# Patient Record
Sex: Female | Born: 1970 | Race: Black or African American | Hispanic: No | Marital: Single | Smoking: Current every day smoker
Health system: Southern US, Community
[De-identification: ages and names within clinical notes are randomized; demographics above are authoritative.]

## PROBLEM LIST (undated history)

## (undated) DIAGNOSIS — G5600 Carpal tunnel syndrome, unspecified upper limb: Secondary | ICD-10-CM

## (undated) DIAGNOSIS — M51369 Other intervertebral disc degeneration, lumbar region without mention of lumbar back pain or lower extremity pain: Secondary | ICD-10-CM

## (undated) DIAGNOSIS — I10 Essential (primary) hypertension: Secondary | ICD-10-CM

## (undated) DIAGNOSIS — F319 Bipolar disorder, unspecified: Secondary | ICD-10-CM

## (undated) DIAGNOSIS — M5126 Other intervertebral disc displacement, lumbar region: Secondary | ICD-10-CM

## (undated) DIAGNOSIS — M5136 Other intervertebral disc degeneration, lumbar region: Secondary | ICD-10-CM

## (undated) DIAGNOSIS — M1990 Unspecified osteoarthritis, unspecified site: Secondary | ICD-10-CM

## (undated) DIAGNOSIS — E119 Type 2 diabetes mellitus without complications: Secondary | ICD-10-CM

---

## 2017-04-20 ENCOUNTER — Emergency Department
Admission: EM | Admit: 2017-04-20 | Discharge: 2017-04-20 | Disposition: A | Discharge status: Home / Self Care | Payer: Medicaid Other | Attending: Emergency Medicine | Admitting: Emergency Medicine

## 2017-04-20 ENCOUNTER — Encounter: Payer: Self-pay | Admitting: Medical Oncology

## 2017-04-20 DIAGNOSIS — Y929 Unspecified place or not applicable: Secondary | ICD-10-CM | POA: Insufficient documentation

## 2017-04-20 DIAGNOSIS — Y939 Activity, unspecified: Secondary | ICD-10-CM | POA: Insufficient documentation

## 2017-04-20 DIAGNOSIS — Y999 Unspecified external cause status: Secondary | ICD-10-CM | POA: Diagnosis not present

## 2017-04-20 DIAGNOSIS — S161XXA Strain of muscle, fascia and tendon at neck level, initial encounter: Secondary | ICD-10-CM | POA: Insufficient documentation

## 2017-04-20 DIAGNOSIS — E119 Type 2 diabetes mellitus without complications: Secondary | ICD-10-CM | POA: Insufficient documentation

## 2017-04-20 DIAGNOSIS — S199XXA Unspecified injury of neck, initial encounter: Secondary | ICD-10-CM | POA: Diagnosis present

## 2017-04-20 DIAGNOSIS — S161XXD Strain of muscle, fascia and tendon at neck level, subsequent encounter: Secondary | ICD-10-CM

## 2017-04-20 DIAGNOSIS — B9689 Other specified bacterial agents as the cause of diseases classified elsewhere: Secondary | ICD-10-CM | POA: Insufficient documentation

## 2017-04-20 DIAGNOSIS — I10 Essential (primary) hypertension: Secondary | ICD-10-CM | POA: Insufficient documentation

## 2017-04-20 DIAGNOSIS — N760 Acute vaginitis: Secondary | ICD-10-CM | POA: Diagnosis not present

## 2017-04-20 HISTORY — DX: Other intervertebral disc degeneration, lumbar region without mention of lumbar back pain or lower extremity pain: M51.369

## 2017-04-20 HISTORY — DX: Other intervertebral disc degeneration, lumbar region: M51.36

## 2017-04-20 HISTORY — DX: Bipolar disorder, unspecified: F31.9

## 2017-04-20 HISTORY — DX: Unspecified osteoarthritis, unspecified site: M19.90

## 2017-04-20 HISTORY — DX: Other intervertebral disc displacement, lumbar region: M51.26

## 2017-04-20 HISTORY — DX: Carpal tunnel syndrome, unspecified upper limb: G56.00

## 2017-04-20 HISTORY — DX: Essential (primary) hypertension: I10

## 2017-04-20 HISTORY — DX: Type 2 diabetes mellitus without complications: E11.9

## 2017-04-20 LAB — CHLAMYDIA/NGC RT PCR (ARMC ONLY)
Chlamydia Tr: NOT DETECTED
N gonorrhoeae: NOT DETECTED

## 2017-04-20 LAB — URINALYSIS, COMPLETE (UACMP) WITH MICROSCOPIC
Bilirubin Urine: NEGATIVE
Glucose, UA: NEGATIVE mg/dL
Ketones, ur: NEGATIVE mg/dL
Nitrite: NEGATIVE
PH: 7 (ref 5.0–8.0)
Protein, ur: NEGATIVE mg/dL
SPECIFIC GRAVITY, URINE: 1.009 (ref 1.005–1.030)

## 2017-04-20 LAB — WET PREP, GENITAL
SPERM: NONE SEEN
TRICH WET PREP: NONE SEEN
YEAST WET PREP: NONE SEEN

## 2017-04-20 MED ORDER — METHOCARBAMOL 750 MG PO TABS: 750.0000 mg | ORAL_TABLET | Freq: Four times a day (QID) | ORAL | 0 refills | Status: DC

## 2017-04-20 MED ORDER — IBUPROFEN 600 MG PO TABS: 600.0000 mg | ORAL_TABLET | Freq: Three times a day (TID) | ORAL | 0 refills | Status: DC | PRN

## 2017-04-20 MED ORDER — METRONIDAZOLE 500 MG PO TABS: 500.0000 mg | ORAL_TABLET | Freq: Two times a day (BID) | ORAL | 0 refills | Status: AC

## 2017-04-20 MED ORDER — TRAMADOL HCL 50 MG PO TABS: 50.0000 mg | ORAL_TABLET | Freq: Four times a day (QID) | ORAL | 0 refills | Status: DC | PRN
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
## 2017-04-20 NOTE — Discharge Instructions (Signed)
Advised to wean off of cervical support as directed. Contact orthopedic department to schedule appointment for evaluation and consideration for physical therapy.
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
## 2017-04-30 ENCOUNTER — Emergency Department
Admission: EM | Admit: 2017-04-30 | Discharge: 2017-05-01 | Disposition: A | Discharge status: Other Acute Inpatient Hospital | Payer: No Typology Code available for payment source | Attending: Emergency Medicine | Admitting: Emergency Medicine

## 2017-04-30 ENCOUNTER — Encounter: Payer: Self-pay | Admitting: Emergency Medicine

## 2017-04-30 DIAGNOSIS — I10 Essential (primary) hypertension: Secondary | ICD-10-CM | POA: Diagnosis not present

## 2017-04-30 DIAGNOSIS — R45851 Suicidal ideations: Secondary | ICD-10-CM | POA: Diagnosis not present

## 2017-04-30 DIAGNOSIS — F17210 Nicotine dependence, cigarettes, uncomplicated: Secondary | ICD-10-CM | POA: Diagnosis not present

## 2017-04-30 DIAGNOSIS — E119 Type 2 diabetes mellitus without complications: Secondary | ICD-10-CM | POA: Insufficient documentation

## 2017-04-30 DIAGNOSIS — F329 Major depressive disorder, single episode, unspecified: Secondary | ICD-10-CM | POA: Insufficient documentation

## 2017-04-30 LAB — COMPREHENSIVE METABOLIC PANEL WITH GFR
ALT: 17 U/L (ref 14–54)
AST: 22 U/L (ref 15–41)
Albumin: 4.7 g/dL (ref 3.5–5.0)
Alkaline Phosphatase: 85 U/L (ref 38–126)
Anion gap: 12 (ref 5–15)
BUN: 10 mg/dL (ref 6–20)
CO2: 19 mmol/L — ABNORMAL LOW (ref 22–32)
Calcium: 9.7 mg/dL (ref 8.9–10.3)
Chloride: 105 mmol/L (ref 101–111)
Creatinine, Ser: 0.89 mg/dL (ref 0.44–1.00)
GFR calc Af Amer: >60 mL/min
GFR calc non Af Amer: >60 mL/min
Glucose, Bld: 289 mg/dL — ABNORMAL HIGH (ref 65–99)
Potassium: 3.1 mmol/L — ABNORMAL LOW (ref 3.5–5.1)
Sodium: 136 mmol/L (ref 135–145)
Total Bilirubin: 0.5 mg/dL (ref 0.3–1.2)
Total Protein: 8.2 g/dL — ABNORMAL HIGH (ref 6.5–8.1)

## 2017-04-30 LAB — URINE DRUG SCREEN, QUALITATIVE (ARMC ONLY)
Amphetamines, Ur Screen: NOT DETECTED
Barbiturates, Ur Screen: NOT DETECTED
Benzodiazepine, Ur Scrn: POSITIVE — AB
Cannabinoid 50 Ng, Ur ~~LOC~~: POSITIVE — AB
Cocaine Metabolite,Ur ~~LOC~~: NOT DETECTED
MDMA (Ecstasy)Ur Screen: NOT DETECTED
Methadone Scn, Ur: NOT DETECTED
Opiate, Ur Screen: NOT DETECTED
Phencyclidine (PCP) Ur S: NOT DETECTED
Tricyclic, Ur Screen: POSITIVE — AB

## 2017-04-30 LAB — SALICYLATE LEVEL: Salicylate Lvl: <7 mg/dL (ref 2.8–30.0)

## 2017-04-30 LAB — LITHIUM LEVEL: Lithium Lvl: <0.06 mmol/L — ABNORMAL LOW (ref 0.60–1.20)

## 2017-04-30 LAB — PREGNANCY, URINE: Preg Test, Ur: NEGATIVE

## 2017-04-30 LAB — CBC
HEMATOCRIT: 42.8 % (ref 35.0–47.0)
HEMOGLOBIN: 14.2 g/dL (ref 12.0–16.0)
MCH: 29.8 pg (ref 26.0–34.0)
MCHC: 33.1 g/dL (ref 32.0–36.0)
MCV: 90 fL (ref 80.0–100.0)
Platelets: 268 10*3/uL (ref 150–440)
RBC: 4.76 MIL/uL (ref 3.80–5.20)
RDW: 13.3 % (ref 11.5–14.5)
WBC: 11.6 10*3/uL — ABNORMAL HIGH (ref 3.6–11.0)

## 2017-04-30 LAB — ACETAMINOPHEN LEVEL: Acetaminophen (Tylenol), Serum: <10 ug/mL — ABNORMAL LOW (ref 10–30)

## 2017-04-30 LAB — ETHANOL

## 2017-04-30 MED ORDER — INSULIN GLARGINE 100 UNIT/ML ~~LOC~~ SOLN
56.0000 [IU] | Freq: Every day | SUBCUTANEOUS | Status: DC
  Administered 2017-05-01: 56 [IU] via SUBCUTANEOUS
  Filled 2017-04-30 (×2): qty 0.56

## 2017-04-30 MED ORDER — POTASSIUM CHLORIDE CRYS ER 20 MEQ PO TBCR
40.0000 meq | EXTENDED_RELEASE_TABLET | Freq: Once | ORAL | Status: AC
  Administered 2017-05-01: 40 meq via ORAL
  Filled 2017-04-30: qty 2

## 2017-04-30 MED ORDER — LORAZEPAM 1 MG PO TABS
1.0000 mg | ORAL_TABLET | Freq: Once | ORAL | Status: AC
  Administered 2017-04-30: 1 mg via ORAL
  Filled 2017-04-30: qty 1
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
## 2017-04-30 NOTE — ED Notes (Signed)
Per Dr. Lamont Snowball, pt okay to not have sitter while in hallway and cooperative at this time.
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
## 2017-04-30 NOTE — ED Triage Notes (Signed)
First Nurse Note:  Arrives with [employer] - Voluntarily with c/o feeling suicidal.  Patient Calm and cooperative.
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
## 2017-05-01 ENCOUNTER — Encounter: Payer: Self-pay | Admitting: *Deleted

## 2017-05-01 ENCOUNTER — Inpatient Hospital Stay
Admission: AD | Admit: 2017-05-01 | Discharge: 2017-05-04 | DRG: 885 | Disposition: A | Discharge status: Home / Self Care | Payer: No Typology Code available for payment source | Attending: Psychiatry | Admitting: Psychiatry

## 2017-05-01 DIAGNOSIS — Z818 Family history of other mental and behavioral disorders: Secondary | ICD-10-CM

## 2017-05-01 DIAGNOSIS — F329 Major depressive disorder, single episode, unspecified: Secondary | ICD-10-CM | POA: Diagnosis not present

## 2017-05-01 DIAGNOSIS — I10 Essential (primary) hypertension: Secondary | ICD-10-CM | POA: Diagnosis present

## 2017-05-01 DIAGNOSIS — Z9119 Patient's noncompliance with other medical treatment and regimen: Secondary | ICD-10-CM | POA: Diagnosis not present

## 2017-05-01 DIAGNOSIS — R45851 Suicidal ideations: Secondary | ICD-10-CM | POA: Diagnosis present

## 2017-05-01 DIAGNOSIS — Z794 Long term (current) use of insulin: Secondary | ICD-10-CM | POA: Diagnosis not present

## 2017-05-01 DIAGNOSIS — F332 Major depressive disorder, recurrent severe without psychotic features: Principal | ICD-10-CM | POA: Diagnosis present

## 2017-05-01 DIAGNOSIS — F1310 Sedative, hypnotic or anxiolytic abuse, uncomplicated: Secondary | ICD-10-CM | POA: Diagnosis present

## 2017-05-01 DIAGNOSIS — F339 Major depressive disorder, recurrent, unspecified: Secondary | ICD-10-CM | POA: Diagnosis present

## 2017-05-01 DIAGNOSIS — F17210 Nicotine dependence, cigarettes, uncomplicated: Secondary | ICD-10-CM | POA: Diagnosis present

## 2017-05-01 DIAGNOSIS — G8929 Other chronic pain: Secondary | ICD-10-CM | POA: Diagnosis present

## 2017-05-01 DIAGNOSIS — E119 Type 2 diabetes mellitus without complications: Secondary | ICD-10-CM | POA: Diagnosis present

## 2017-05-01 DIAGNOSIS — K219 Gastro-esophageal reflux disease without esophagitis: Secondary | ICD-10-CM | POA: Diagnosis present

## 2017-05-01 DIAGNOSIS — F5105 Insomnia due to other mental disorder: Secondary | ICD-10-CM | POA: Diagnosis present

## 2017-05-01 DIAGNOSIS — F1210 Cannabis abuse, uncomplicated: Secondary | ICD-10-CM | POA: Diagnosis not present

## 2017-05-01 DIAGNOSIS — E785 Hyperlipidemia, unspecified: Secondary | ICD-10-CM | POA: Diagnosis present

## 2017-05-01 DIAGNOSIS — F4310 Post-traumatic stress disorder, unspecified: Secondary | ICD-10-CM | POA: Diagnosis present

## 2017-05-01 DIAGNOSIS — F17200 Nicotine dependence, unspecified, uncomplicated: Secondary | ICD-10-CM | POA: Diagnosis present

## 2017-05-01 DIAGNOSIS — N760 Acute vaginitis: Secondary | ICD-10-CM | POA: Diagnosis present

## 2017-05-01 LAB — GLUCOSE, CAPILLARY
GLUCOSE-CAPILLARY: 165 mg/dL — AB (ref 65–99)
GLUCOSE-CAPILLARY: 199 mg/dL — AB (ref 65–99)

## 2017-05-01 MED ORDER — FLUOXETINE HCL 20 MG PO CAPS
20.0000 mg | ORAL_CAPSULE | Freq: Every day | ORAL | Status: DC
  Administered 2017-05-02 – 2017-05-04 (×3): 20 mg via ORAL
  Filled 2017-05-01 (×3): qty 1

## 2017-05-01 MED ORDER — QUETIAPINE FUMARATE 200 MG PO TABS
300.0000 mg | ORAL_TABLET | Freq: Every day | ORAL | Status: DC
  Administered 2017-05-01 – 2017-05-03 (×3): 300 mg via ORAL
  Filled 2017-05-01 (×3): qty 1

## 2017-05-01 MED ORDER — TRAMADOL HCL 50 MG PO TABS
50.0000 mg | ORAL_TABLET | Freq: Once | ORAL | Status: AC
  Administered 2017-05-01: 50 mg via ORAL
  Filled 2017-05-01: qty 1

## 2017-05-01 MED ORDER — INSULIN GLARGINE 100 UNIT/ML ~~LOC~~ SOLN: 56.0000 [IU] | Freq: Every day | SUBCUTANEOUS | Status: DC

## 2017-05-01 MED ORDER — INSULIN ASPART 100 UNIT/ML ~~LOC~~ SOLN
0.0000 [IU] | Freq: Three times a day (TID) | SUBCUTANEOUS | Status: DC
  Administered 2017-05-02 (×3): 2 [IU] via SUBCUTANEOUS
  Administered 2017-05-03: 1 [IU] via SUBCUTANEOUS
  Filled 2017-05-01 (×3): qty 1

## 2017-05-01 MED ORDER — LABETALOL HCL 100 MG PO TABS
100.0000 mg | ORAL_TABLET | Freq: Every day | ORAL | Status: DC
  Administered 2017-05-02 – 2017-05-04 (×3): 100 mg via ORAL
  Filled 2017-05-01 (×4): qty 1

## 2017-05-01 MED ORDER — METFORMIN HCL 500 MG PO TABS
1000.0000 mg | ORAL_TABLET | Freq: Two times a day (BID) | ORAL | Status: DC
  Administered 2017-05-02 – 2017-05-04 (×5): 1000 mg via ORAL
  Filled 2017-05-01 (×5): qty 2

## 2017-05-01 MED ORDER — PANTOPRAZOLE SODIUM 40 MG PO TBEC
40.0000 mg | DELAYED_RELEASE_TABLET | Freq: Every day | ORAL | Status: DC
  Administered 2017-05-01 – 2017-05-04 (×4): 40 mg via ORAL
  Filled 2017-05-01 (×4): qty 1

## 2017-05-01 MED ORDER — TRAMADOL HCL 50 MG PO TABS
50.0000 mg | ORAL_TABLET | Freq: Four times a day (QID) | ORAL | Status: DC | PRN
Start: 2017-05-01 — End: 2017-05-04
  Administered 2017-05-01 – 2017-05-04 (×3): 50 mg via ORAL
  Filled 2017-05-01 (×4): qty 1

## 2017-05-01 MED ORDER — ATORVASTATIN CALCIUM 20 MG PO TABS
40.0000 mg | ORAL_TABLET | Freq: Every day | ORAL | Status: DC
  Administered 2017-05-01 – 2017-05-04 (×4): 40 mg via ORAL
  Filled 2017-05-01 (×4): qty 2

## 2017-05-01 MED ORDER — GABAPENTIN 400 MG PO CAPS
800.0000 mg | ORAL_CAPSULE | Freq: Three times a day (TID) | ORAL | Status: DC
  Administered 2017-05-01 – 2017-05-04 (×9): 800 mg via ORAL
  Filled 2017-05-01 (×9): qty 2

## 2017-05-01 MED ORDER — CLONAZEPAM 0.5 MG PO TABS
0.5000 mg | ORAL_TABLET | Freq: Two times a day (BID) | ORAL | Status: DC
  Administered 2017-05-01 – 2017-05-02 (×2): 0.5 mg via ORAL
  Filled 2017-05-01 (×2): qty 1

## 2017-05-01 MED ORDER — ALUM & MAG HYDROXIDE-SIMETH 200-200-20 MG/5ML PO SUSP: 30.0000 mL | ORAL | Status: DC | PRN

## 2017-05-01 MED ORDER — TRAMADOL HCL 50 MG PO TABS: 50.0000 mg | ORAL_TABLET | Freq: Once | ORAL | Status: DC

## 2017-05-01 MED ORDER — TRAMADOL HCL 50 MG PO TABS
50.0000 mg | ORAL_TABLET | Freq: Once | ORAL | Status: AC
  Administered 2017-05-01: 50 mg via ORAL
  Filled 2017-05-01 (×2): qty 1

## 2017-05-01 MED ORDER — MAGNESIUM HYDROXIDE 400 MG/5ML PO SUSP: 30.0000 mL | Freq: Every day | ORAL | Status: DC | PRN

## 2017-05-01 MED ORDER — ACETAMINOPHEN 500 MG PO TABS: 1000.0000 mg | ORAL_TABLET | Freq: Once | ORAL | Status: DC

## 2017-05-01 MED ORDER — INSULIN GLARGINE 100 UNIT/ML ~~LOC~~ SOLN
56.0000 [IU] | Freq: Every day | SUBCUTANEOUS | Status: DC
  Administered 2017-05-01 – 2017-05-03 (×3): 56 [IU] via SUBCUTANEOUS
  Filled 2017-05-01 (×4): qty 0.56

## 2017-05-01 MED ORDER — METHOCARBAMOL 750 MG PO TABS
750.0000 mg | ORAL_TABLET | Freq: Four times a day (QID) | ORAL | Status: DC
  Administered 2017-05-01 – 2017-05-04 (×12): 750 mg via ORAL
  Filled 2017-05-01 (×12): qty 1

## 2017-05-01 MED ORDER — ACETAMINOPHEN 325 MG PO TABS: 650.0000 mg | ORAL_TABLET | Freq: Four times a day (QID) | ORAL | Status: DC | PRN
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
## 2017-05-01 NOTE — ED Notes (Signed)
Patient asleep in room. No noted distress or abnormal behavior. Will continue 15 minute checks and observation by security cameras for safety. 
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
## 2017-05-01 NOTE — ED Notes (Addendum)
Pt complaining of neck pain 10/10.  EDP notified and ordered Ultram. Administered as ordered.  Pt laying in bed resting. Maintained on 15 minute checks and observation by security camera for safety.

## 2017-05-01 NOTE — ED Notes (Addendum)
Patient resting quietly in room. No noted distress or abnormal behaviors noted. Will continue 15 minute checks and observation by security camera for safety. 

## 2017-05-01 NOTE — Tx Team (Signed)
Initial Treatment Plan 05/01/2017 7:04 PM Richrd Humbles NFA:[id number]    PATIENT STRESSORS: Health problems Medication change or noncompliance Traumatic event   PATIENT STRENGTHS: Ability for insight Average or above average intelligence Capable of independent living Motivation for treatment/growth   PATIENT IDENTIFIED PROBLEMS: "get stable on my medications"  Feel better  Develop coping skills                 DISCHARGE CRITERIA:  Improved stabilization in mood, thinking, and/or behavior  PRELIMINARY DISCHARGE PLAN: Outpatient therapy  PATIENT/FAMILY INVOLVEMENT: This treatment plan has been presented to and reviewed with the patient, Nancy Chapman, and/or family member, .  The patient and family have been given the opportunity to ask questions and make suggestions.  Elige Radon, RN 05/01/2017, 7:04 PM

## 2017-05-01 NOTE — BH Assessment (Signed)
Patient is to be admitted to [facility] by Dr. Maryruth Bun.  Attending Physician will be Dr. Jennet Maduro.   Patient has been assigned to room 306A, by [facility] Charge Nurse Dedra Skeens Amy,  Patient's Nurse & Marylene Land Patient Access).

## 2017-05-01 NOTE — ED Provider Notes (Signed)
-----------------------------------------   7:04 AM on 05/01/2017 -----------------------------------------   Blood pressure 114/73, pulse 85, temperature 98.1 F (36.7 C), temperature source Oral, resp. rate 20, height 5' (1.524 m), weight 79.8 kg (176 lb), SpO2 100 %.  The patient had no acute events since last update.  Calm and cooperative at this time.  Disposition is pending Psychiatry/Behavioral Medicine team recommendations.     Willy Eddy, MD 05/01/17 [phone]

## 2017-05-01 NOTE — ED Notes (Signed)
Pt discharged to BMU.  Pt is IVC. VS stable. All belongings sent with patient. Report called to BMU.  Pt accepting of inpatient admission.

## 2017-05-02 ENCOUNTER — Encounter: Payer: Self-pay | Admitting: Psychiatry

## 2017-05-02 DIAGNOSIS — I10 Essential (primary) hypertension: Secondary | ICD-10-CM | POA: Diagnosis present

## 2017-05-02 DIAGNOSIS — F1210 Cannabis abuse, uncomplicated: Secondary | ICD-10-CM | POA: Diagnosis present

## 2017-05-02 DIAGNOSIS — F332 Major depressive disorder, recurrent severe without psychotic features: Principal | ICD-10-CM

## 2017-05-02 DIAGNOSIS — E119 Type 2 diabetes mellitus without complications: Secondary | ICD-10-CM

## 2017-05-02 DIAGNOSIS — F17200 Nicotine dependence, unspecified, uncomplicated: Secondary | ICD-10-CM | POA: Diagnosis present

## 2017-05-02 DIAGNOSIS — K219 Gastro-esophageal reflux disease without esophagitis: Secondary | ICD-10-CM | POA: Diagnosis present

## 2017-05-02 LAB — GLUCOSE, CAPILLARY
GLUCOSE-CAPILLARY: 156 mg/dL — AB (ref 65–99)
GLUCOSE-CAPILLARY: 160 mg/dL — AB (ref 65–99)
Glucose-Capillary: 168 mg/dL — ABNORMAL HIGH (ref 65–99)
Glucose-Capillary: 152 mg/dL — ABNORMAL HIGH (ref 65–99)

## 2017-05-02 LAB — LIPID PANEL
Cholesterol: 153 mg/dL (ref 0–200)
HDL: 25 mg/dL — ABNORMAL LOW (ref >40)
LDL CALC: 97 mg/dL (ref 0–99)
Total CHOL/HDL Ratio: 6.1 RATIO
Triglycerides: 157 mg/dL — ABNORMAL HIGH (ref <150)
VLDL: 31 mg/dL (ref 0–40)

## 2017-05-02 LAB — HEMOGLOBIN A1C
Hgb A1c MFr Bld: 8 % — ABNORMAL HIGH (ref 4.8–5.6)
Mean Plasma Glucose: 182.9 mg/dL

## 2017-05-02 MED ORDER — CLONAZEPAM 0.5 MG PO TABS
0.5000 mg | ORAL_TABLET | Freq: Two times a day (BID) | ORAL | Status: DC
  Administered 2017-05-02 – 2017-05-04 (×4): 0.5 mg via ORAL
  Filled 2017-05-02 (×4): qty 1

## 2017-05-02 MED ORDER — METRONIDAZOLE 0.75 % VA GEL
1.0000 | Freq: Every day | VAGINAL | Status: DC
  Administered 2017-05-02 – 2017-05-03 (×2): 1 via VAGINAL
  Filled 2017-05-02: qty 70

## 2017-05-02 MED ORDER — NICOTINE 7 MG/24HR TD PT24
7.0000 mg | MEDICATED_PATCH | Freq: Every day | TRANSDERMAL | Status: DC
  Filled 2017-05-02 (×2): qty 1

## 2017-05-02 NOTE — Plan of Care (Signed)
Problem: Safety: Goal: Periods of time without injury will increase Outcome: Progressing Pt safe on the unit at this time   
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
## 2017-05-02 NOTE — Plan of Care (Signed)
Problem: Safety: Goal: Ability to disclose and discuss suicidal ideas will improve Outcome: Progressing Patient denies any thoughts of harm to self and others  Problem: Spiritual Needs Goal: Ability to function at adequate level Outcome: Progressing Patient is up and about during the day and showing positive attitude about self
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
## 2017-05-02 NOTE — [facility] Suicide Risk Assessment (Signed)
[facility] INPATIENT:  Family/Significant Other Suicide Prevention Education  Suicide Prevention Education:  Patient Refusal for Family/Significant Other Suicide Prevention Education: The patient Nancy Chapman has refused to provide written consent for family/significant other to be provided Family/Significant Other Suicide Prevention Education during admission and/or prior to discharge.  Physician notified.  Verdene Lennert 05/02/2017, 11:13 AM

## 2017-05-02 NOTE — [facility] Group Notes (Signed)
[facility] LCSW Group Therapy 05/02/2017 1:15pm  Type of Therapy: Group Therapy- Feelings Around Discharge & Establishing a Supportive Framework  Participation Level:  None  Description of Group:   What is a supportive framework? What does it look like feel like and how do I discern it from and unhealthy non-supportive network? Learn how to cope when supports are not helpful and don't support you. Discuss what to do when your family/friends are not supportive.  Summary of Patient Progress Pt did not participate in group discussion but was attentive throughout.   Therapeutic Modalities:   Cognitive Behavioral Therapy Person-Centered Therapy Motivational Interviewing   Verdene Lennert, LCSW 05/02/2017 12:54 PM

## 2017-05-02 NOTE — Progress Notes (Signed)
Patient rated her depression and anxiety 10/10.Denies suicidal or homicidal ideations and AV hallucinations.In & out room.Minimal interactions with peers.Compliant with medications.Attended groups.Appetite and energy level good.Support and encouragement given.

## 2017-05-02 NOTE — Plan of Care (Signed)
Problem: Coping: Goal: Ability to verbalize feelings will improve Outcome: Progressing Patient is able to verbalize her feelings with staff.  Problem: Safety: Goal: Ability to disclose and discuss suicidal ideas will improve Outcome: Progressing Patient verbalized passive suicidal thoughts ,contracts for safety.  Problem: Self-Concept: Goal: Ability to verbalize positive feelings about self will improve Outcome: Progressing Patient verbalized that she could attend groups.
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
## 2017-05-02 NOTE — BHH Group Notes (Signed)
B[facility] Group Notes:  (Nursing/MHT/Case Management/Adjunct)  Date:  05/02/2017  Time:  4:53 AM  Type of Therapy:  Psychoeducational Skills  Participation Level:  Did Not Attend  Summary of Progress/Problems:  Nancy Chapman 05/02/2017, 4:53 AM

## 2017-05-02 NOTE — Progress Notes (Signed)
D: Pt denies SI/HI/AVH. Pt is pleasant and cooperative. Pt presents a little depressed, but brightens with conversation . Pt stated she was ready to go, planned to get back to her developmentally challenged son.   A: Pt was offered support and encouragement. Pt was given scheduled medications. Pt was encourage to attend groups. Q 15 minute checks were done for safety.   R:Pt attends groups and interacts well with peers and staff. Pt is taking medication. Pt has no complaints.Pt receptive to treatment and safety maintained on unit.

## 2017-05-03 LAB — GLUCOSE, CAPILLARY
GLUCOSE-CAPILLARY: 89 mg/dL (ref 65–99)
GLUCOSE-CAPILLARY: 104 mg/dL — AB (ref 65–99)
Glucose-Capillary: 120 mg/dL — ABNORMAL HIGH (ref 65–99)
Glucose-Capillary: 149 mg/dL — ABNORMAL HIGH (ref 65–99)

## 2017-05-03 MED ORDER — QUETIAPINE FUMARATE 300 MG PO TABS: 300.0000 mg | ORAL_TABLET | Freq: Every day | ORAL | 1 refills | Status: DC

## 2017-05-03 MED ORDER — FLUOXETINE HCL 20 MG PO CAPS: 20.0000 mg | ORAL_CAPSULE | Freq: Every day | ORAL | 1 refills | Status: DC

## 2017-05-03 MED ORDER — METFORMIN HCL 1000 MG PO TABS: 1000.0000 mg | ORAL_TABLET | Freq: Two times a day (BID) | ORAL | 1 refills | Status: DC

## 2017-05-03 MED ORDER — INSULIN GLARGINE 100 UNIT/ML ~~LOC~~ SOLN: 56.0000 [IU] | Freq: Every day | SUBCUTANEOUS | 11 refills | Status: AC

## 2017-05-03 MED ORDER — INSULIN GLARGINE 100 UNIT/ML ~~LOC~~ SOLN: 56.0000 [IU] | Freq: Every day | SUBCUTANEOUS | 11 refills | Status: DC

## 2017-05-03 MED ORDER — LITHIUM CARBONATE 300 MG PO CAPS: 300.0000 mg | ORAL_CAPSULE | Freq: Every day | ORAL | 1 refills | Status: AC

## 2017-05-03 MED ORDER — QUETIAPINE FUMARATE 300 MG PO TABS: 300.0000 mg | ORAL_TABLET | Freq: Every day | ORAL | 1 refills | Status: AC

## 2017-05-03 MED ORDER — LITHIUM CARBONATE 300 MG PO CAPS: 300.0000 mg | ORAL_CAPSULE | Freq: Every day | ORAL | 1 refills | Status: DC

## 2017-05-03 MED ORDER — GABAPENTIN 800 MG PO TABS: 800.0000 mg | ORAL_TABLET | Freq: Three times a day (TID) | ORAL | 1 refills | Status: AC

## 2017-05-03 MED ORDER — ATORVASTATIN CALCIUM 40 MG PO TABS: 40.0000 mg | ORAL_TABLET | Freq: Every day | ORAL | 1 refills | Status: AC

## 2017-05-03 MED ORDER — LABETALOL HCL 100 MG PO TABS: 100.0000 mg | ORAL_TABLET | Freq: Every day | ORAL | 1 refills | Status: AC

## 2017-05-03 MED ORDER — GABAPENTIN 800 MG PO TABS: 800.0000 mg | ORAL_TABLET | Freq: Three times a day (TID) | ORAL | 1 refills | Status: DC

## 2017-05-03 MED ORDER — LABETALOL HCL 100 MG PO TABS: 100.0000 mg | ORAL_TABLET | Freq: Every day | ORAL | 1 refills | Status: DC

## 2017-05-03 MED ORDER — OMEPRAZOLE 10 MG PO CPDR: 10.0000 mg | DELAYED_RELEASE_CAPSULE | Freq: Every day | ORAL | 1 refills | Status: AC

## 2017-05-03 MED ORDER — FLUOXETINE HCL 20 MG PO CAPS: 20.0000 mg | ORAL_CAPSULE | Freq: Every day | ORAL | 1 refills | Status: AC

## 2017-05-03 MED ORDER — LITHIUM CARBONATE ER 300 MG PO TBCR
300.0000 mg | EXTENDED_RELEASE_TABLET | Freq: Every day | ORAL | Status: DC
  Administered 2017-05-03: 300 mg via ORAL
  Filled 2017-05-03: qty 1

## 2017-05-03 MED ORDER — METFORMIN HCL 1000 MG PO TABS: 1000.0000 mg | ORAL_TABLET | Freq: Two times a day (BID) | ORAL | 1 refills | Status: AC

## 2017-05-03 MED ORDER — OMEPRAZOLE 10 MG PO CPDR: 10.0000 mg | DELAYED_RELEASE_CAPSULE | Freq: Every day | ORAL | 1 refills | Status: DC

## 2017-05-03 MED ORDER — ATORVASTATIN CALCIUM 40 MG PO TABS: 40.0000 mg | ORAL_TABLET | Freq: Every day | ORAL | 1 refills | Status: DC
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
## 2017-05-03 NOTE — Plan of Care (Signed)
Problem: Coping: Goal: Ability to verbalize feelings will improve Outcome: Progressing Patient able to converse with nurse about  feelings
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
## 2017-05-03 NOTE — Tx Team (Signed)
Interdisciplinary Treatment and Diagnostic Plan Update  05/03/2017 Time of Session: 10:30am Nancy Chapman MRN: [id number]  Principal Diagnosis: Severe episode of recurrent major depressive disorder, without psychotic features (HCC)  Secondary Diagnoses: Principal Problem:   Severe episode of recurrent major depressive disorder, without psychotic features (HCC) Active Problems:   Cannabis use disorder, mild, abuse   Diabetes (HCC)   HTN (hypertension)   GERD (gastroesophageal reflux disease)   Tobacco use disorder   Current Medications:  Current Facility-Administered Medications  Medication Dose Route Frequency Provider Last Rate Last Dose  . acetaminophen (TYLENOL) tablet 650 mg  650 mg Oral Q6H PRN Darliss Ridgel, MD      . alum & mag hydroxide-simeth (MAALOX/MYLANTA) 200-200-20 MG/5ML suspension 30 mL  30 mL Oral Q4H PRN Darliss Ridgel, MD      . atorvastatin (LIPITOR) tablet 40 mg  40 mg Oral Daily Darliss Ridgel, MD   40 mg at 05/03/17 0844  . clonazePAM (KLONOPIN) tablet 0.5 mg  0.5 mg Oral BID Darliss Ridgel, MD   0.5 mg at 05/03/17 0845  . FLUoxetine (PROZAC) capsule 20 mg  20 mg Oral Daily Darliss Ridgel, MD   20 mg at 05/03/17 0845  . gabapentin (NEURONTIN) capsule 800 mg  800 mg Oral TID Darliss Ridgel, MD   800 mg at 05/03/17 1341  . insulin aspart (novoLOG) injection 0-9 Units  0-9 Units Subcutaneous TID WC Darliss Ridgel, MD   1 Units at 05/03/17 1341  . insulin glargine (LANTUS) injection 56 Units  56 Units Subcutaneous QHS Darliss Ridgel, MD   56 Units at 05/02/17 2107  . labetalol (NORMODYNE) tablet 100 mg  100 mg Oral Daily Darliss Ridgel, MD   100 mg at 05/03/17 0845  . lithium carbonate (LITHOBID) CR tablet 300 mg  300 mg Oral QHS Pucilowska, Jolanta B, MD      . magnesium hydroxide (MILK OF MAGNESIA) suspension 30 mL  30 mL Oral Daily PRN Darliss Ridgel, MD      . metFORMIN (GLUCOPHAGE) tablet 1,000 mg  1,000 mg Oral BID WC Darliss Ridgel, MD   1,000 mg at 05/03/17  0844  . methocarbamol (ROBAXIN) tablet 750 mg  750 mg Oral QID Darliss Ridgel, MD   750 mg at 05/03/17 1340  . metroNIDAZOLE (METROGEL) 0.75 % vaginal gel 1 Applicatorful  1 Applicatorful Vaginal QHS Darliss Ridgel, MD   1 Applicatorful at 05/02/17 2107  . nicotine (NICODERM CQ - dosed in mg/24 hr) patch 7 mg  7 mg Transdermal Daily Darliss Ridgel, MD      . pantoprazole (PROTONIX) EC tablet 40 mg  40 mg Oral Daily Darliss Ridgel, MD   40 mg at 05/03/17 0845  . QUEtiapine (SEROQUEL) tablet 300 mg  300 mg Oral QHS Darliss Ridgel, MD   300 mg at 05/02/17 2106  . traMADol (ULTRAM) tablet 50 mg  50 mg Oral Q6H PRN Darliss Ridgel, MD   50 mg at 05/02/17 2106   PTA Medications: Prescriptions Prior to Admission  Medication Sig Dispense Refill Last Dose  . atorvastatin (LIPITOR) 40 MG tablet Take 40 mg by mouth daily.     Marland Kitchen FLUoxetine (PROZAC) 20 MG capsule Take 20 mg by mouth daily.     Marland Kitchen gabapentin (NEURONTIN) 800 MG tablet Take 800 mg by mouth 3 (three) times daily.     Marland Kitchen ibuprofen (ADVIL,MOTRIN) 600 MG tablet Take 1 tablet (600 mg  total) by mouth every 8 (eight) hours as needed. (Patient not taking: Reported on 04/30/2017) 15 tablet 0 Not Taking at Unknown time  . insulin glargine (LANTUS) 100 UNIT/ML injection Inject 56 Units into the skin at bedtime.     Marland Kitchen labetalol (NORMODYNE) 100 MG tablet Take 100 mg by mouth daily.     Marland Kitchen lithium carbonate 300 MG capsule Take 300 mg by mouth daily.     . methocarbamol (ROBAXIN-750) 750 MG tablet Take 1 tablet (750 mg total) by mouth 4 (four) times daily. (Patient not taking: Reported on 04/30/2017) 20 tablet 0 Not Taking at Unknown time  . omeprazole (PRILOSEC) 10 MG capsule Take 10 mg by mouth daily.     . QUEtiapine (SEROQUEL) 300 MG tablet Take 300 mg by mouth at bedtime.     . traMADol (ULTRAM) 50 MG tablet Take 1 tablet (50 mg total) by mouth every 6 (six) hours as needed. (Patient not taking: Reported on 04/30/2017) 20 tablet 0 Not Taking at Unknown time     Patient Stressors: Health problems Medication change or noncompliance Traumatic event  Patient Strengths: Ability for insight Average or above average intelligence Capable of independent living Motivation for treatment/growth  Treatment Modalities: Medication Management, Group therapy, Case management,  1 to 1 session with clinician, Psychoeducation, Recreational therapy.   Physician Treatment Plan for Primary Diagnosis: Severe episode of recurrent major depressive disorder, without psychotic features (HCC) Long Term Goal(s): Improvement in symptoms so as ready for discharge Improvement in symptoms so as ready for discharge   Short Term Goals: Ability to disclose and discuss suicidal ideas Ability to demonstrate self-control will improve Ability to identify and develop effective coping behaviors will improve Ability to maintain clinical measurements within normal limits will improve Ability to identify changes in lifestyle to reduce recurrence of condition will improve Ability to verbalize feelings will improve Ability to identify and develop effective coping behaviors will improve Ability to identify triggers associated with substance abuse/mental health issues will improve  Medication Management: Evaluate patient's response, side effects, and tolerance of medication regimen.  Therapeutic Interventions: 1 to 1 sessions, Unit Group sessions and Medication administration.  Evaluation of Outcomes: Progressing  Physician Treatment Plan for Secondary Diagnosis: Principal Problem:   Severe episode of recurrent major depressive disorder, without psychotic features (HCC) Active Problems:   Cannabis use disorder, mild, abuse   Diabetes (HCC)   HTN (hypertension)   GERD (gastroesophageal reflux disease)   Tobacco use disorder  Long Term Goal(s): Improvement in symptoms so as ready for discharge Improvement in symptoms so as ready for discharge   Short Term Goals: Ability to  disclose and discuss suicidal ideas Ability to demonstrate self-control will improve Ability to identify and develop effective coping behaviors will improve Ability to maintain clinical measurements within normal limits will improve Ability to identify changes in lifestyle to reduce recurrence of condition will improve Ability to verbalize feelings will improve Ability to identify and develop effective coping behaviors will improve Ability to identify triggers associated with substance abuse/mental health issues will improve     Medication Management: Evaluate patient's response, side effects, and tolerance of medication regimen.  Therapeutic Interventions: 1 to 1 sessions, Unit Group sessions and Medication administration.  Evaluation of Outcomes: Progressing   RN Treatment Plan for Primary Diagnosis: Severe episode of recurrent major depressive disorder, without psychotic features (HCC) Long Term Goal(s): Knowledge of disease and therapeutic regimen to maintain health will improve  Short Term Goals: Ability to verbalize feelings will  improve, Ability to identify and develop effective coping behaviors will improve and Compliance with prescribed medications will improve  Medication Management: RN will administer medications as ordered by provider, will assess and evaluate patient's response and provide education to patient for prescribed medication. RN will report any adverse and/or side effects to prescribing provider.  Therapeutic Interventions: 1 on 1 counseling sessions, Psychoeducation, Medication administration, Evaluate responses to treatment, Monitor vital signs and CBGs as ordered, Perform/monitor CIWA, COWS, AIMS and Fall Risk screenings as ordered, Perform wound care treatments as ordered.  Evaluation of Outcomes: Progressing   LCSW Treatment Plan for Primary Diagnosis: Severe episode of recurrent major depressive disorder, without psychotic features (HCC) Long Term Goal(s):  Safe transition to appropriate next level of care at discharge, Engage patient in therapeutic group addressing interpersonal concerns.  Short Term Goals: Engage patient in aftercare planning with referrals and resources, Increase social support, Increase ability to appropriately verbalize feelings, Identify triggers associated with mental health/substance abuse issues and Increase skills for wellness and recovery  Therapeutic Interventions: Assess for all discharge needs, 1 to 1 time with Social worker, Explore available resources and support systems, Assess for adequacy in community support network, Educate family and significant other(s) on suicide prevention, Complete Psychosocial Assessment, Interpersonal group therapy.  Evaluation of Outcomes: Progressing   Progress in Treatment: Attending groups: Yes. Participating in groups: Yes. Taking medication as prescribed: Yes. Toleration medication: Yes. Family/Significant other contact made: Yes, individual(s) contacted:    Patient understands diagnosis: Yes. Discussing patient identified problems/goals with staff: Yes. Medical problems stabilized or resolved: Yes. Denies suicidal/homicidal ideation: Yes. Issues/concerns per patient self-inventory: No. Other:    New problem(s) identified: No, Describe:     New Short Term/Long Term Goal(s):  Discharge Plan or Barriers:   Reason for Continuation of Hospitalization: Anxiety Depression Medication stabilization  Estimated Length of Stay:1 day   Attendees: Patient:Nancy Chapman [facility] 05/03/2017 4:23 PM  Physician: Kristine Linea 05/03/2017 4:23 PM  Nursing: Marjo Bicker 05/03/2017 4:23 PM  RN Care Manager: 05/03/2017 4:23 PM  Social Worker: Jake Shark, LCSW 05/03/2017 4:23 PM  Recreational Therapist:  05/03/2017 4:23 PM  Other:  05/03/2017 4:23 PM  Other:  05/03/2017 4:23 PM  Other: 05/03/2017 4:23 PM    Scribe for Treatment Team: Glennon Mac, LCSW 05/03/2017 4:23 PM

## 2017-05-03 NOTE — [facility] Group Notes (Signed)
[facility] Group Notes:  (Nursing/MHT/Case Management/Adjunct)  Date:  05/03/2017  Time:  3:32 PM  Type of Therapy:  Psychoeducational Skills  Participation Level:  Active  Participation Quality:  Appropriate, Attentive and Sharing  Affect:  Appropriate  Cognitive:  Alert, Appropriate and Oriented  Insight:  Appropriate and Good  Engagement in Group:  Engaged  Modes of Intervention:  Discussion, Education, Exploration and Socialization  Summary of Progress/Problems:  Nancy Chapman 05/03/2017, 3:32 PM

## 2017-05-04 LAB — GLUCOSE, CAPILLARY
Glucose-Capillary: 102 mg/dL — ABNORMAL HIGH (ref 65–99)
Glucose-Capillary: 145 mg/dL — ABNORMAL HIGH (ref 65–99)

## 2017-05-04 NOTE — Progress Notes (Addendum)
Receive Nancy Chapman this am in her room after breakfast. She was compliant with her medications. She rated her depression and anxiety a 3/4 on a scale of 10.  She denied feeling suicidal. She feels she is stable at this time and safe for discharge home. She received her discharge order and her discharge instructions reviewed and her questions were answered. Her personal belongings were returned and she was discharged to the shelter without incident to the bus stop via cursory vehicle .
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
## 2017-05-04 NOTE — Progress Notes (Signed)
  [facility] Adult Case Management Discharge Plan :  Will you be returning to the same living situation after discharge:  Yes,  [facility] At discharge, do you have transportation home?: No. Bus pass provided. Do you have the ability to pay for your medications: Yes,  medicaid  Release of information consent forms completed and in the chart;  Patient's signature needed at discharge.  Patient to Follow up at: Follow-up Information    [facility]. Go in 7 day(s).   Specialty:  Behavioral Health Why:  Please attend a walk in appointment for medication management between the hours of 9am-2pm, Monday-Friday.  Please bring a copy of your hospital discharge paperwork. Contact information: [address] [place] [address] [phone]          Next level of care provider has access to [facility] Link:no  Safety Planning and Suicide Prevention discussed: No.Pt refused consent.  Have you used any form of tobacco in the last 30 days? (Cigarettes, Smokeless Tobacco, Cigars, and/or Pipes): Yes  Has patient been referred to the Quitline?: Patient refused referral  Patient has been referred for addiction treatment: Yes  Lorri Frederick, LCSW 05/04/2017, 3:12 PM

## 2017-05-04 NOTE — [facility] Group Notes (Signed)
[facility] Group Notes:  (Nursing/MHT/Case Management/Adjunct)  Date:  05/04/2017  Time:  1:41 AM  Type of Therapy:  Psychoeducational Skills  Participation Level:  Active  Participation Quality:  Appropriate and Sharing  Affect:  Appropriate  Cognitive:  Alert and Appropriate  Insight:  Appropriate  Engagement in Group:  Engaged  Modes of Intervention:  Discussion, Socialization and Support  Summary of Progress/Problems:  Wilson Singer 05/04/2017, 1:41 AM
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
## 2017-05-04 NOTE — Plan of Care (Signed)
Problem: Activity: Goal: Sleeping patterns will improve Outcome: Progressing Patient slept for Estimated Hours of 7.30; Precautionary checks every 15 minutes for safety maintained, room free of safety hazards, patient sustains no injury or falls during this shift.    

## 2017-05-04 NOTE — [facility] Suicide Risk Assessment (Signed)
[facility] Discharge Suicide Risk Assessment   Principal Problem: Severe episode of recurrent major depressive disorder, without psychotic features [facility]) Discharge Diagnoses:  Patient Active Problem List   Diagnosis Date Noted  . Diabetes (HCC) [E11.9] 05/02/2017  . HTN (hypertension) [I10] 05/02/2017  . GERD (gastroesophageal reflux disease) [K21.9] 05/02/2017  . Tobacco use disorder [F17.200] 05/02/2017  . Cannabis use disorder, mild, abuse [F12.10]   . Severe episode of recurrent major depressive disorder, without psychotic features (HCC) [F33.2] 05/01/2017    Total Time spent with patient: 30 minutes  Musculoskeletal: Strength & Muscle Tone: within normal limits Gait & Station: normal Patient leans: N/A  Psychiatric Specialty Exam: Review of Systems  Musculoskeletal: Positive for back pain and myalgias.  Neurological: Negative.   Psychiatric/Behavioral: Positive for substance abuse.  All other systems reviewed and are negative.   Blood pressure 109/69, pulse 92, temperature 98.2 F (36.8 C), resp. rate 18, SpO2 100 %.There is no height or weight on file to calculate BMI.  General Appearance: Casual  Eye Contact::  Good  Speech:  Clear and Coherent409  Volume:  Normal  Mood:  Euthymic  Affect:  Appropriate  Thought Process:  Goal Directed and Descriptions of Associations: Intact  Orientation:  Full (Time, Place, and Person)  Thought Content:  WDL  Suicidal Thoughts:  No  Homicidal Thoughts:  No  Memory:  Immediate;   Fair Recent;   Fair Remote;   Fair  Judgement:  Impaired  Insight:  Shallow  Psychomotor Activity:  Normal  Concentration:  Fair  Recall:  [employer] of Knowledge:Fair  Language: Fair  Akathisia:  No  Handed:  Right  AIMS (if indicated):     Assets:  Communication Skills Desire for Improvement Financial Resources/Insurance Resilience Social Support  Sleep:  Number of Hours: 7.3  Cognition: WNL  ADL's:  Intact   Mental Status Per Nursing  Assessment::   On Admission:     Demographic Factors:  Divorced or widowed, Caucasian, Low socioeconomic status and Unemployed  Loss Factors: Loss of significant relationship and Financial problems/change in socioeconomic status  Historical Factors: Prior suicide attempts, Family history of mental illness or substance abuse and Impulsivity  Risk Reduction Factors:   Responsible for children under 36 years of age, Sense of responsibility to family and Positive social support  Continued Clinical Symptoms:  Depression:   Comorbid alcohol abuse/dependence Impulsivity Alcohol/Substance Abuse/Dependencies  Cognitive Features That Contribute To Risk:  None    Suicide Risk:  Minimal: No identifiable suicidal ideation.  Patients presenting with no risk factors but with morbid ruminations; may be classified as minimal risk based on the severity of the depressive symptoms    Plan Of Care/Follow-up recommendations:  Activity:  as tolerated. Diet:  low sodium heart healthy ADA diet. Other:  keep follow up appointments.  Kristine Linea, MD 05/04/2017, 8:07 AM

## 2017-05-06 ENCOUNTER — Emergency Department
Admission: EM | Admit: 2017-05-06 | Discharge: 2017-05-07 | Disposition: A | Discharge status: Home / Self Care | Payer: Medicaid Other | Attending: Student in an Organized Health Care Education/Training Program | Admitting: Student in an Organized Health Care Education/Training Program

## 2017-05-06 ENCOUNTER — Encounter: Payer: Self-pay | Admitting: Emergency Medicine

## 2017-05-06 ENCOUNTER — Emergency Department: Payer: Medicaid Other

## 2017-05-06 DIAGNOSIS — S161XXA Strain of muscle, fascia and tendon at neck level, initial encounter: Secondary | ICD-10-CM | POA: Diagnosis not present

## 2017-05-06 DIAGNOSIS — M545 Low back pain, unspecified: Secondary | ICD-10-CM

## 2017-05-06 DIAGNOSIS — E119 Type 2 diabetes mellitus without complications: Secondary | ICD-10-CM | POA: Diagnosis not present

## 2017-05-06 DIAGNOSIS — X58XXXA Exposure to other specified factors, initial encounter: Secondary | ICD-10-CM | POA: Diagnosis not present

## 2017-05-06 DIAGNOSIS — Y999 Unspecified external cause status: Secondary | ICD-10-CM | POA: Insufficient documentation

## 2017-05-06 DIAGNOSIS — I10 Essential (primary) hypertension: Secondary | ICD-10-CM | POA: Diagnosis not present

## 2017-05-06 DIAGNOSIS — F17210 Nicotine dependence, cigarettes, uncomplicated: Secondary | ICD-10-CM | POA: Diagnosis not present

## 2017-05-06 DIAGNOSIS — Y929 Unspecified place or not applicable: Secondary | ICD-10-CM | POA: Diagnosis not present

## 2017-05-06 DIAGNOSIS — Y939 Activity, unspecified: Secondary | ICD-10-CM | POA: Diagnosis not present

## 2017-05-06 DIAGNOSIS — G8929 Other chronic pain: Secondary | ICD-10-CM | POA: Diagnosis not present

## 2017-05-06 DIAGNOSIS — Z794 Long term (current) use of insulin: Secondary | ICD-10-CM | POA: Diagnosis not present

## 2017-05-06 DIAGNOSIS — M542 Cervicalgia: Secondary | ICD-10-CM | POA: Diagnosis present

## 2017-05-06 MED ORDER — DIAZEPAM 5 MG PO TABS
5.0000 mg | ORAL_TABLET | Freq: Once | ORAL | Status: AC
  Administered 2017-05-07: 5 mg via ORAL
  Filled 2017-05-06: qty 1

## 2017-05-06 MED ORDER — NAPROXEN 500 MG PO TABS
500.0000 mg | ORAL_TABLET | Freq: Once | ORAL | Status: AC
  Administered 2017-05-07: 500 mg via ORAL
  Filled 2017-05-06: qty 1

## 2017-05-06 MED ORDER — HALOPERIDOL LACTATE 5 MG/ML IJ SOLN
5.0000 mg | Freq: Once | INTRAMUSCULAR | Status: AC
  Administered 2017-05-07: 5 mg via INTRAMUSCULAR
  Filled 2017-05-06: qty 1
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
## 2017-05-07 MED ORDER — CYCLOBENZAPRINE HCL 10 MG PO TABS: 10.0000 mg | ORAL_TABLET | Freq: Three times a day (TID) | ORAL | 0 refills | Status: AC | PRN

## 2017-05-07 MED ORDER — LIDOCAINE 5 % EX PTCH: 1.0000 | MEDICATED_PATCH | Freq: Two times a day (BID) | CUTANEOUS | 0 refills | Status: AC

## 2017-05-07 NOTE — ED Notes (Signed)
Reviewed d/c instructions, follow-up care, prescriptions with patient. E-sig pad not working. Paper copy of signature placed in chart

## 2017-05-07 NOTE — Discharge Instructions (Signed)

## 2017-05-14 ENCOUNTER — Other Ambulatory Visit: Payer: Self-pay | Admitting: Sports Medicine

## 2017-05-14 DIAGNOSIS — M542 Cervicalgia: Principal | ICD-10-CM

## 2017-05-14 DIAGNOSIS — R29898 Other symptoms and signs involving the musculoskeletal system: Secondary | ICD-10-CM

## 2017-05-14 DIAGNOSIS — G8929 Other chronic pain: Secondary | ICD-10-CM

## 2018-03-15 IMAGING — CR DG CERVICAL SPINE COMPLETE 4+V
7 series · 7 of 7 positions shown · non-contrast
Comparison: None

CLINICAL DATA: Neck pain since being lifted up by her head by her
significant other in February 2017, pain worsened tonight at 7777 hrs,
history hypertension, diabetes mellitus

EXAM:
CERVICAL SPINE - COMPLETE 4+ VIEW

[c-spine lat]
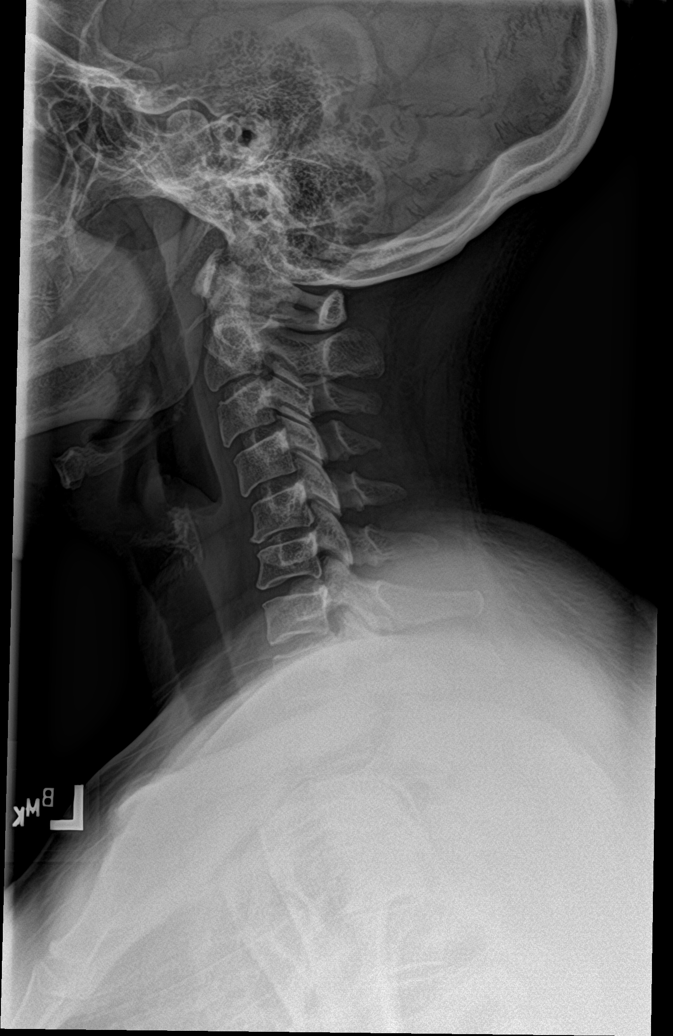

[c-spine obl]
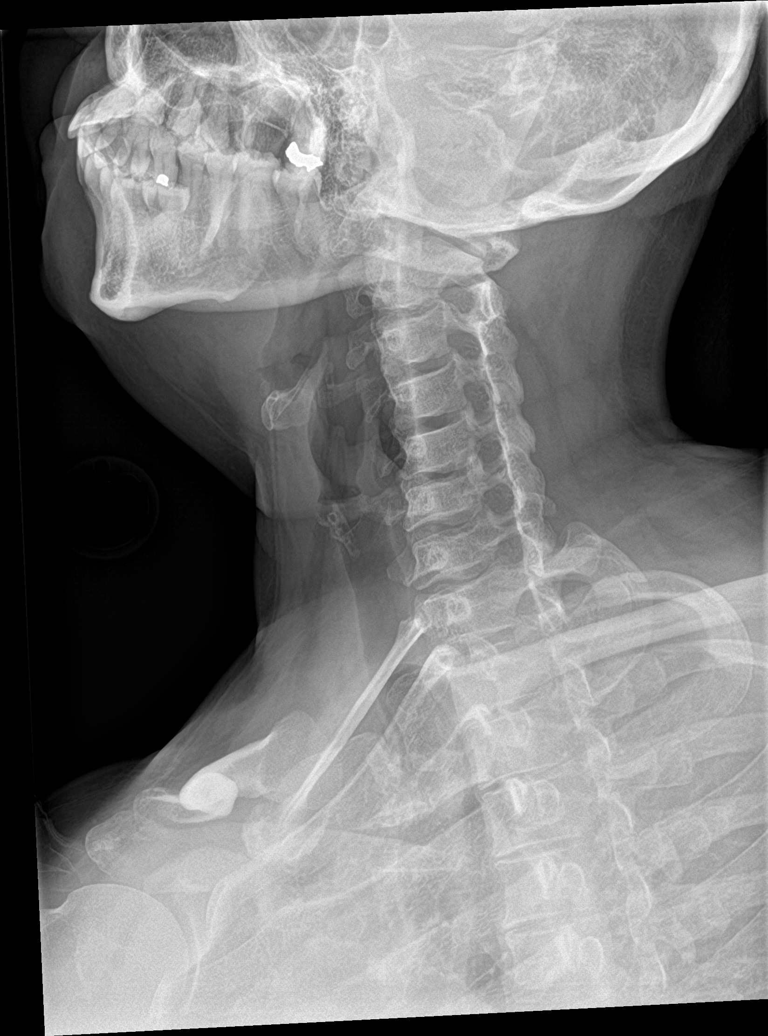

[c-spine ap]
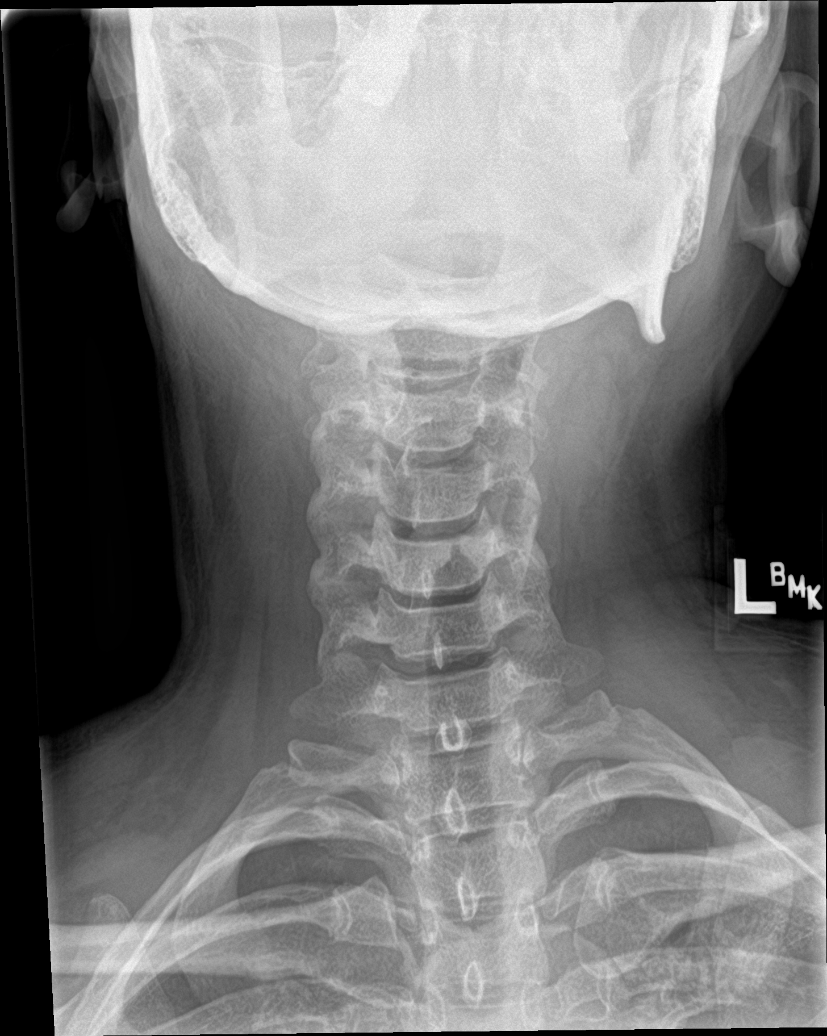

[c-spine open mouth (1 of 2)]
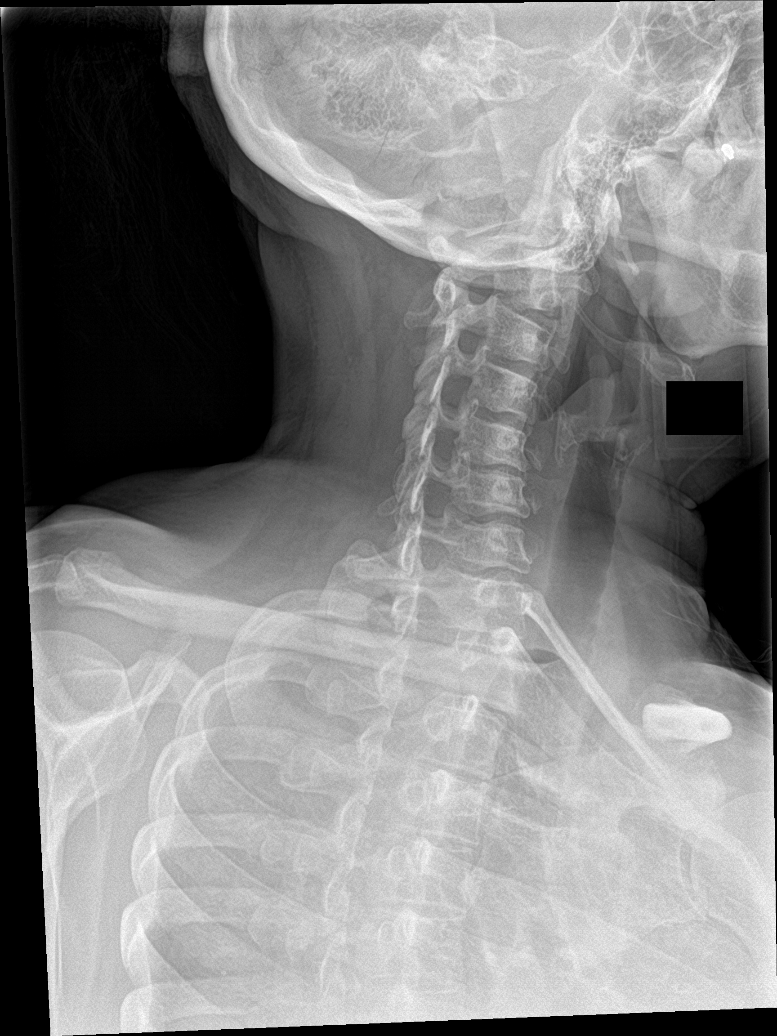

[[person_name]]
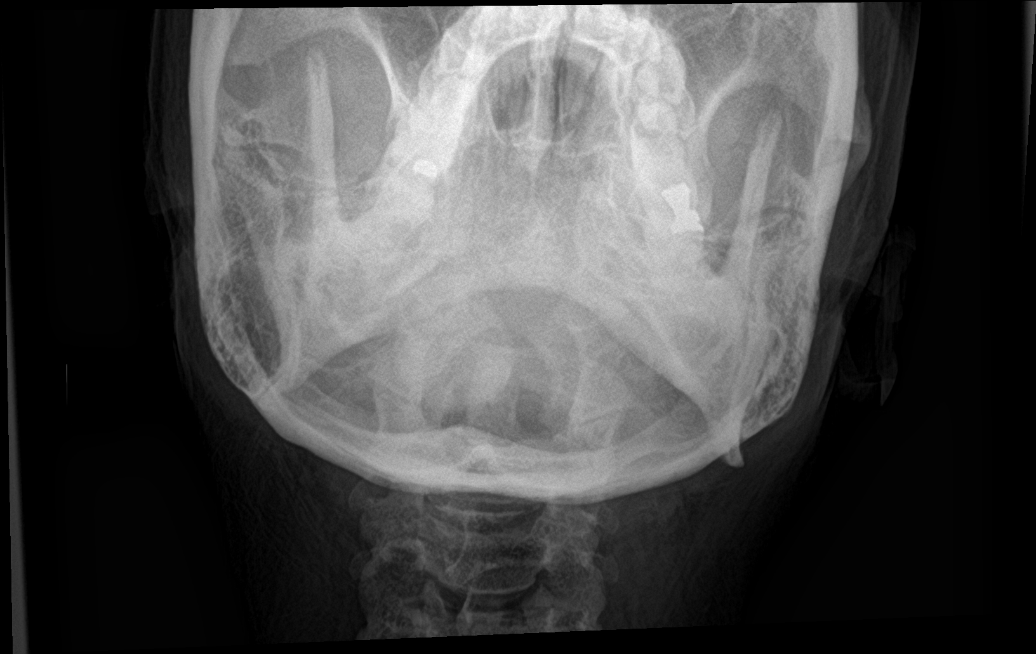

[c-spine swimmers]
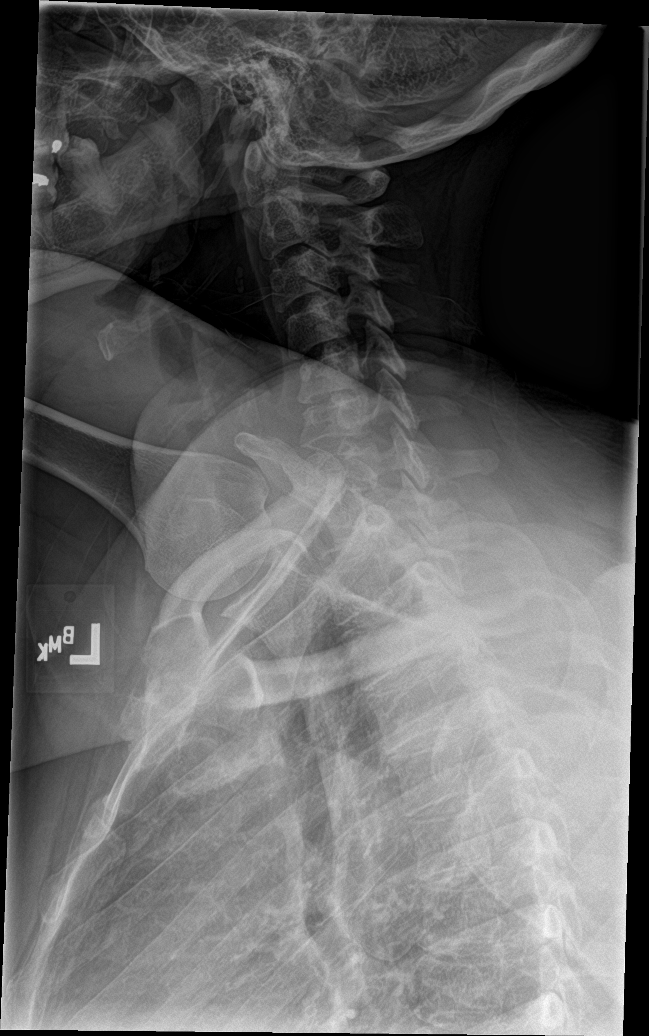

[c-spine open mouth (2 of 2)]
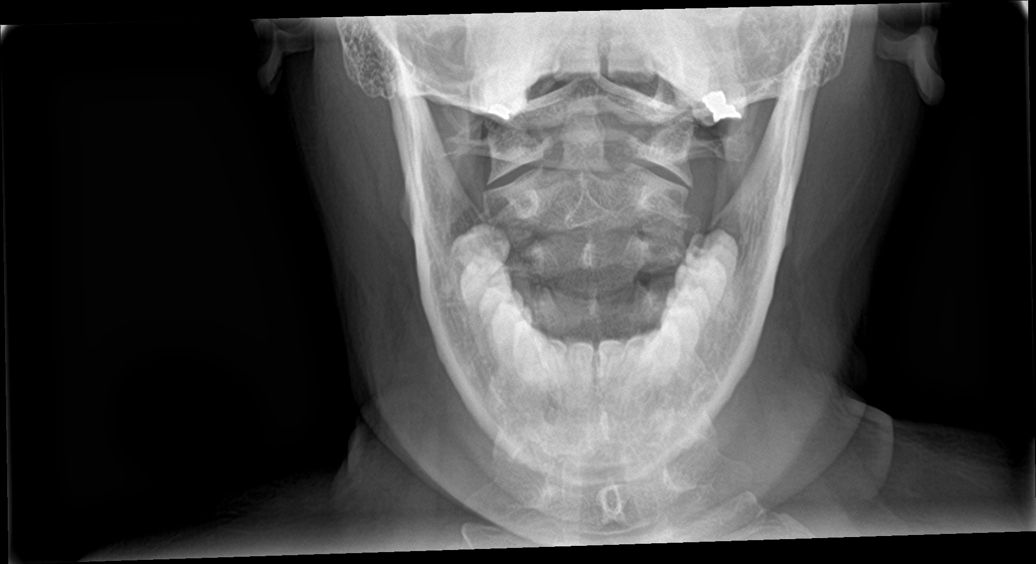

[7 of 7 positions shown; findings below may reference images not displayed]

FINDINGS: Prevertebral soft tissues normal thickness.

Reversal of cervical lordosis question muscle spasm.

Osseous mineralization grossly normal.

Slight disc space narrowing C5-C6.

Vertebral body and this by size otherwise maintained.

No acute fracture, subluxation, or bone destruction.

Bony neural foramina grossly patent.

C1-C2 alignment normal for slight rotation.
IMPRESSION: Question muscle spasm.

Mild degenerative disc disease changes at C5-C6.

No acute bony abnormalities.

## 2020-07-15 ENCOUNTER — Other Ambulatory Visit: Payer: Self-pay

## 2020-07-15 DIAGNOSIS — R519 Headache, unspecified: Secondary | ICD-10-CM | POA: Insufficient documentation

## 2020-07-15 DIAGNOSIS — R0981 Nasal congestion: Secondary | ICD-10-CM | POA: Insufficient documentation

## 2020-07-15 DIAGNOSIS — Z5321 Procedure and treatment not carried out due to patient leaving prior to being seen by health care provider: Secondary | ICD-10-CM | POA: Insufficient documentation

## 2020-07-15 LAB — URINALYSIS, COMPLETE (UACMP) WITH MICROSCOPIC
Bacteria, UA: NONE SEEN
Bilirubin Urine: NEGATIVE
Glucose, UA: NEGATIVE mg/dL
Hgb urine dipstick: NEGATIVE
Ketones, ur: NEGATIVE mg/dL
Nitrite: NEGATIVE
Protein, ur: NEGATIVE mg/dL
Specific Gravity, Urine: 1.014 (ref 1.005–1.030)
pH: 5 (ref 5.0–8.0)

## 2020-07-15 LAB — CHLAMYDIA/NGC RT PCR (ARMC ONLY)
Chlamydia Tr: NOT DETECTED
N gonorrhoeae: NOT DETECTED

## 2020-07-15 NOTE — ED Triage Notes (Addendum)
Pt in with co headache that started today. Pt also has some sinus pressure and congestion. Pt also co abnormal discharge for over a month, no urinary frequency.

## 2020-07-16 ENCOUNTER — Emergency Department
Admission: EM | Admit: 2020-07-16 | Discharge: 2020-07-16 | Disposition: A | Discharge status: Home / Self Care | Payer: Medicaid Other | Attending: Emergency Medicine | Admitting: Emergency Medicine

## 2020-07-16 LAB — POC URINE PREG, ED: Preg Test, Ur: NEGATIVE

## 2020-07-16 NOTE — ED Notes (Signed)
No answer when called several times from lobby
